# Patient Record
Sex: Female | Born: 1945 | Hispanic: No | Marital: Married | Smoking: Never smoker
Health system: Southern US, Community
[De-identification: ages and names within clinical notes are randomized; demographics above are authoritative.]

## PROBLEM LIST (undated history)

## (undated) DIAGNOSIS — K219 Gastro-esophageal reflux disease without esophagitis: Secondary | ICD-10-CM

---

## 2010-05-11 ENCOUNTER — Encounter: Admission: RE | Admit: 2010-05-11 | Discharge: 2010-05-11 | Payer: Self-pay | Admitting: Otolaryngology

## 2010-05-19 ENCOUNTER — Ambulatory Visit
Admission: RE | Admit: 2010-05-19 | Discharge: 2010-05-19 | Payer: Self-pay | Source: Home / Self Care | Admitting: Otolaryngology

## 2011-06-14 ENCOUNTER — Other Ambulatory Visit: Payer: Self-pay | Admitting: Family Medicine

## 2011-06-14 DIAGNOSIS — Z1231 Encounter for screening mammogram for malignant neoplasm of breast: Secondary | ICD-10-CM

## 2011-06-30 ENCOUNTER — Ambulatory Visit
Admission: RE | Admit: 2011-06-30 | Discharge: 2011-06-30 | Disposition: A | Discharge status: Home / Self Care | Payer: Medicare Other | Source: Ambulatory Visit | Attending: Family Medicine | Admitting: Family Medicine

## 2011-06-30 DIAGNOSIS — Z1231 Encounter for screening mammogram for malignant neoplasm of breast: Secondary | ICD-10-CM

## 2011-11-09 ENCOUNTER — Other Ambulatory Visit: Payer: Self-pay | Admitting: Family Medicine

## 2011-11-09 DIAGNOSIS — R1013 Epigastric pain: Secondary | ICD-10-CM

## 2011-11-11 ENCOUNTER — Ambulatory Visit
Admission: RE | Admit: 2011-11-11 | Discharge: 2011-11-11 | Disposition: A | Discharge status: Home / Self Care | Payer: Medicare Other | Source: Ambulatory Visit | Attending: Family Medicine | Admitting: Family Medicine

## 2011-11-11 DIAGNOSIS — R1013 Epigastric pain: Secondary | ICD-10-CM

## 2012-08-09 ENCOUNTER — Other Ambulatory Visit: Payer: Self-pay | Admitting: Family Medicine

## 2012-08-09 DIAGNOSIS — B1910 Unspecified viral hepatitis B without hepatic coma: Secondary | ICD-10-CM

## 2012-08-17 ENCOUNTER — Other Ambulatory Visit: Payer: Self-pay | Admitting: Family Medicine

## 2012-08-17 ENCOUNTER — Ambulatory Visit
Admission: RE | Admit: 2012-08-17 | Discharge: 2012-08-17 | Disposition: A | Discharge status: Home / Self Care | Payer: Medicare Other | Source: Ambulatory Visit | Attending: Family Medicine | Admitting: Family Medicine

## 2012-08-17 DIAGNOSIS — B1910 Unspecified viral hepatitis B without hepatic coma: Secondary | ICD-10-CM

## 2013-10-18 ENCOUNTER — Other Ambulatory Visit: Payer: Self-pay | Admitting: Family Medicine

## 2013-10-18 DIAGNOSIS — Z1231 Encounter for screening mammogram for malignant neoplasm of breast: Secondary | ICD-10-CM

## 2013-11-12 ENCOUNTER — Ambulatory Visit
Admission: RE | Admit: 2013-11-12 | Discharge: 2013-11-12 | Disposition: A | Discharge status: Home / Self Care | Payer: Medicare Other | Source: Ambulatory Visit | Attending: Family Medicine | Admitting: Family Medicine

## 2013-11-12 DIAGNOSIS — Z1231 Encounter for screening mammogram for malignant neoplasm of breast: Secondary | ICD-10-CM

## 2013-12-25 ENCOUNTER — Other Ambulatory Visit (HOSPITAL_COMMUNITY): Payer: Self-pay | Admitting: Family Medicine

## 2013-12-25 DIAGNOSIS — M25462 Effusion, left knee: Secondary | ICD-10-CM

## 2013-12-26 ENCOUNTER — Ambulatory Visit (HOSPITAL_COMMUNITY)
Admission: RE | Admit: 2013-12-26 | Discharge: 2013-12-26 | Disposition: A | Discharge status: Home / Self Care | Payer: Medicare Other | Source: Ambulatory Visit | Attending: Family Medicine | Admitting: Family Medicine

## 2013-12-26 DIAGNOSIS — M25462 Effusion, left knee: Secondary | ICD-10-CM

## 2013-12-26 DIAGNOSIS — M7989 Other specified soft tissue disorders: Secondary | ICD-10-CM

## 2013-12-26 NOTE — Progress Notes (Signed)
Left lower extremity venous duplex completed.  Left:  No evidence of DVT, superficial thrombosis, or Baker's cyst.  Right:  Negative for DVT in the common femoral vein.  

## 2015-05-30 ENCOUNTER — Encounter (HOSPITAL_BASED_OUTPATIENT_CLINIC_OR_DEPARTMENT_OTHER): Payer: Self-pay | Admitting: Emergency Medicine

## 2015-05-30 ENCOUNTER — Emergency Department (HOSPITAL_BASED_OUTPATIENT_CLINIC_OR_DEPARTMENT_OTHER): Payer: Medicare Other

## 2015-05-30 ENCOUNTER — Emergency Department (HOSPITAL_BASED_OUTPATIENT_CLINIC_OR_DEPARTMENT_OTHER)
Admission: EM | Admit: 2015-05-30 | Discharge: 2015-05-30 | Disposition: A | Discharge status: Home / Self Care | Payer: Medicare Other | Attending: Emergency Medicine | Admitting: Emergency Medicine

## 2015-05-30 DIAGNOSIS — R1013 Epigastric pain: Secondary | ICD-10-CM | POA: Diagnosis present

## 2015-05-30 DIAGNOSIS — Z79899 Other long term (current) drug therapy: Secondary | ICD-10-CM | POA: Diagnosis not present

## 2015-05-30 DIAGNOSIS — K219 Gastro-esophageal reflux disease without esophagitis: Secondary | ICD-10-CM | POA: Diagnosis not present

## 2015-05-30 HISTORY — DX: Gastro-esophageal reflux disease without esophagitis: K21.9

## 2015-05-30 LAB — CBC WITH DIFFERENTIAL/PLATELET
BASOS PCT: 0 % (ref 0–1)
Basophils Absolute: 0 10*3/uL (ref 0.0–0.1)
EOS ABS: 0 10*3/uL (ref 0.0–0.7)
EOS PCT: 1 % (ref 0–5)
HCT: 41.8 % (ref 36.0–46.0)
Hemoglobin: 14.4 g/dL (ref 12.0–15.0)
Lymphocytes Relative: 26 % (ref 12–46)
Lymphs Abs: 1.4 10*3/uL (ref 0.7–4.0)
MCH: 32.4 pg (ref 26.0–34.0)
MCHC: 34.4 g/dL (ref 30.0–36.0)
MCV: 94.1 fL (ref 78.0–100.0)
MONO ABS: 0.5 10*3/uL (ref 0.1–1.0)
MONOS PCT: 10 % (ref 3–12)
Neutro Abs: 3.3 10*3/uL (ref 1.7–7.7)
Neutrophils Relative %: 63 % (ref 43–77)
Platelets: 154 10*3/uL (ref 150–400)
RBC: 4.44 MIL/uL (ref 3.87–5.11)
RDW: 11.7 % (ref 11.5–15.5)
WBC: 5.3 10*3/uL (ref 4.0–10.5)

## 2015-05-30 LAB — URINALYSIS, ROUTINE W REFLEX MICROSCOPIC
BILIRUBIN URINE: NEGATIVE
Glucose, UA: NEGATIVE mg/dL
Hgb urine dipstick: NEGATIVE
Ketones, ur: NEGATIVE mg/dL
NITRITE: NEGATIVE
PH: 8.5 — AB (ref 5.0–8.0)
Protein, ur: NEGATIVE mg/dL
SPECIFIC GRAVITY, URINE: 1.015 (ref 1.005–1.030)
Urobilinogen, UA: 0.2 mg/dL (ref 0.0–1.0)

## 2015-05-30 LAB — COMPREHENSIVE METABOLIC PANEL
ALBUMIN: 4.2 g/dL (ref 3.5–5.0)
ALK PHOS: 53 U/L (ref 38–126)
ALT: 21 U/L (ref 14–54)
ANION GAP: 9 (ref 5–15)
AST: 30 U/L (ref 15–41)
BILIRUBIN TOTAL: 1.1 mg/dL (ref 0.3–1.2)
BUN: 10 mg/dL (ref 6–20)
CALCIUM: 9.3 mg/dL (ref 8.9–10.3)
CO2: 27 mmol/L (ref 22–32)
CREATININE: 0.67 mg/dL (ref 0.44–1.00)
Chloride: 103 mmol/L (ref 101–111)
GFR calc Af Amer: >60 mL/min (ref >60)
GFR calc non Af Amer: >60 mL/min (ref >60)
GLUCOSE: 131 mg/dL — AB (ref 65–99)
Potassium: 3.7 mmol/L (ref 3.5–5.1)
Sodium: 139 mmol/L (ref 135–145)
TOTAL PROTEIN: 7 g/dL (ref 6.5–8.1)

## 2015-05-30 LAB — URINE MICROSCOPIC-ADD ON

## 2015-05-30 LAB — LIPASE, BLOOD: Lipase: 28 U/L (ref 22–51)

## 2015-05-30 MED ORDER — FENTANYL CITRATE (PF) 100 MCG/2ML IJ SOLN
100.0000 ug | Freq: Once | INTRAMUSCULAR | Status: AC
  Administered 2015-05-30: 100 ug via INTRAVENOUS
  Filled 2015-05-30: qty 2

## 2015-05-30 MED ORDER — ONDANSETRON HCL 4 MG/2ML IJ SOLN
4.0000 mg | Freq: Once | INTRAMUSCULAR | Status: AC
  Administered 2015-05-30: 4 mg via INTRAVENOUS
  Filled 2015-05-30: qty 2

## 2015-05-30 MED ORDER — PANTOPRAZOLE SODIUM 40 MG IV SOLR: 40.0000 mg | Freq: Once | INTRAVENOUS | Status: DC

## 2015-05-30 MED ORDER — SODIUM CHLORIDE 0.9 % IV SOLN
INTRAVENOUS | Status: DC
Start: 2015-05-30 — End: 2015-05-30
  Administered 2015-05-30: 05:00:00 via INTRAVENOUS

## 2015-05-30 MED ORDER — IOHEXOL 300 MG/ML  SOLN
25.0000 mL | Freq: Once | INTRAMUSCULAR | Status: AC | PRN
  Administered 2015-05-30: 25 mL via ORAL

## 2015-05-30 MED ORDER — IOHEXOL 300 MG/ML  SOLN
100.0000 mL | Freq: Once | INTRAMUSCULAR | Status: AC | PRN
  Administered 2015-05-30: 100 mL via INTRAVENOUS

## 2015-05-30 NOTE — ED Notes (Signed)
Pt speaks vietnamese, son at bedside with limited english, pt also has brought what she vomited at home for md to evaluate

## 2015-05-30 NOTE — ED Notes (Signed)
Pt reports mid epigastric abdominal pain with immediate onset with nausea and vomiting, denies sob, dizziness

## 2015-05-30 NOTE — ED Provider Notes (Signed)
CSN: [id number]     Arrival date & time 05/30/15  0401 History   First MD Initiated Contact with Patient 05/30/15 0413     Chief Complaint  Patient presents with  . Abdominal Pain     (Consider location/radiation/quality/duration/timing/severity/associated sxs/prior Treatment) HPI  This is a 69 year old female with a history of GERD. She is here with epigastric pain that began about 9 PM yesterday evening and is gradually worsened. It is now severe. It is worse with palpation or movement. She has had associated nausea and vomiting. She has not had a fever, shortness of breath or lightheadedness.   Past Medical History  Diagnosis Date  . GERD (gastroesophageal reflux disease)    History reviewed. No pertinent past surgical history. History reviewed. No pertinent family history. Social History  Substance Use Topics  . Smoking status: Never Smoker   . Smokeless tobacco: None  . Alcohol Use: No   OB History    No data available     Review of Systems  All other systems reviewed and are negative.   Allergies  Review of patient's allergies indicates no known allergies.  Home Medications   Prior to Admission medications   Medication Sig Start Date End Date Taking? Authorizing Provider  alum & mag hydroxide-simeth (MAALOX/MYLANTA) 200-200-20 MG/5ML suspension Take by mouth every 6 (six) hours as needed for indigestion or heartburn.   Yes Historical Provider, MD  esomeprazole (NEXIUM) 10 MG packet Take 10 mg by mouth daily before breakfast.   Yes Historical Provider, MD   BP 171/77 mmHg  Pulse 94  Temp(Src) 98 F (36.7 C) (Oral)  Resp 20  Wt 132 lb 9 oz (60.13 kg)  SpO2 96%   Physical Exam  General: Well-developed, well-nourished female in no acute distress; appearance consistent with age of record HENT: normocephalic; atraumatic Eyes: pupils equal, round and reactive to light; extraocular muscles intact; arcus senilis bilaterally Neck: supple Heart: regular rate and  rhythm Lungs: clear to auscultation bilaterally Abdomen: soft; nondistended; pronounced epigastric tenderness; no masses or hepatosplenomegaly; bowel sounds present; no gallstones seen on bedside ultrasound Extremities: No deformity; full range of motion; pulses normal Neurologic: Awake, alert; motor function intact in all extremities and symmetric; no facial droop Skin: Warm and dry Psychiatric: Flat affect    ED Course  Procedures (including critical care time)   MDM  Nursing notes and vitals signs, including pulse oximetry, reviewed.  Summary of this visit's results, reviewed by myself:   EKG Interpretation  Date/Time:  Friday May 30 2015 04:23:22 EDT Ventricular Rate:  91 PR Interval:  138 QRS Duration: 82 QT Interval:  370 QTC Calculation: 455 R Axis:   52 Text Interpretation:  Normal sinus rhythm Possible Left atrial enlargement Borderline ECG Rate is faster Confirmed by Ania Levay  MD, Jonny Ruiz ([id number]) on 05/30/2015 4:31:50 AM       Labs:  Results for orders placed or performed during the hospital encounter of 05/30/15 (from the past 24 hour(s))  Urinalysis, Routine w reflex microscopic (not at [facility])     Status: Abnormal   Collection Time: 05/30/15  4:14 AM  Result Value Ref Range   Color, Urine YELLOW YELLOW   APPearance CLOUDY (A) CLEAR   Specific Gravity, Urine 1.015 1.005 - 1.030   pH 8.5 (H) 5.0 - 8.0   Glucose, UA NEGATIVE NEGATIVE mg/dL   Hgb urine dipstick NEGATIVE NEGATIVE   Bilirubin Urine NEGATIVE NEGATIVE   Ketones, ur NEGATIVE NEGATIVE mg/dL   Protein, ur NEGATIVE NEGATIVE mg/dL  Urobilinogen, UA 0.2 0.0 - 1.0 mg/dL   Nitrite NEGATIVE NEGATIVE   Leukocytes, UA LARGE (A) NEGATIVE  Urine microscopic-add on     Status: Abnormal   Collection Time: 05/30/15  4:14 AM  Result Value Ref Range   Squamous Epithelial / LPF RARE RARE   WBC, UA 11-20 <3 WBC/hpf   RBC / HPF 3-6 <3 RBC/hpf   Bacteria, UA MANY (A) RARE   Urine-Other AMORPHOUS URATES/PHOSPHATES    Comprehensive metabolic panel     Status: Abnormal   Collection Time: 05/30/15  4:40 AM  Result Value Ref Range   Sodium 139 135 - 145 mmol/L   Potassium 3.7 3.5 - 5.1 mmol/L   Chloride 103 101 - 111 mmol/L   CO2 27 22 - 32 mmol/L   Glucose, Bld 131 (H) 65 - 99 mg/dL   BUN 10 6 - 20 mg/dL   Creatinine, Ser 1.61 0.44 - 1.00 mg/dL   Calcium 9.3 8.9 - 09.6 mg/dL   Total Protein 7.0 6.5 - 8.1 g/dL   Albumin 4.2 3.5 - 5.0 g/dL   AST 30 15 - 41 U/L   ALT 21 14 - 54 U/L   Alkaline Phosphatase 53 38 - 126 U/L   Total Bilirubin 1.1 0.3 - 1.2 mg/dL   GFR calc non Af Amer >60 >60 mL/min   GFR calc Af Amer >60 >60 mL/min   Anion gap 9 5 - 15  Lipase, blood     Status: None   Collection Time: 05/30/15  4:40 AM  Result Value Ref Range   Lipase 28 22 - 51 U/L  CBC with Differential/Platelet     Status: None   Collection Time: 05/30/15  4:40 AM  Result Value Ref Range   WBC 5.3 4.0 - 10.5 K/uL   RBC 4.44 3.87 - 5.11 MIL/uL   Hemoglobin 14.4 12.0 - 15.0 g/dL   HCT 04.5 40.9 - 81.1 %   MCV 94.1 78.0 - 100.0 fL   MCH 32.4 26.0 - 34.0 pg   MCHC 34.4 30.0 - 36.0 g/dL   RDW 91.4 78.2 - 95.6 %   Platelets 154 150 - 400 K/uL   Neutrophils Relative % 63 43 - 77 %   Neutro Abs 3.3 1.7 - 7.7 K/uL   Lymphocytes Relative 26 12 - 46 %   Lymphs Abs 1.4 0.7 - 4.0 K/uL   Monocytes Relative 10 3 - 12 %   Monocytes Absolute 0.5 0.1 - 1.0 K/uL   Eosinophils Relative 1 0 - 5 %   Eosinophils Absolute 0.0 0.0 - 0.7 K/uL   Basophils Relative 0 0 - 1 %   Basophils Absolute 0.0 0.0 - 0.1 K/uL    Imaging Studies: Ct Abdomen Pelvis W Contrast  05/30/2015   CLINICAL DATA:  Epigastric abdominal pain.  Nausea and vomiting.  EXAM: CT ABDOMEN AND PELVIS WITH CONTRAST  TECHNIQUE: Multidetector CT imaging of the abdomen and pelvis was performed using the standard protocol following bolus administration of intravenous contrast.  CONTRAST:  25mL OMNIPAQUE IOHEXOL 300 MG/ML SOLN, OMNIPAQUE IOHEXOL 300 MG/ML SOLN   COMPARISON:  None.  FINDINGS: Lower chest:  The included lung bases are clear.  Liver: No focal lesion.  Hepatobiliary: Gallbladder physiologically distended. No biliary dilatation.  Pancreas: Normal.  Spleen: Normal.  Adrenal glands: No nodule.  Kidneys: Symmetric renal enhancement. No hydronephrosis. Small subcentimeter cortical cysts in the left kidney.  Stomach/Bowel: Stomach physiologically distended. There is a short-segment of dilated small bowel  in the right upper quadrant/mid abdomen with feculization of small bowel contents. The more distal bowel loops are decompressed. No pneumatosis, wall thickening or edema, or mesenteric swirling. Small volume of stool throughout the colon without colonic wall thickening. The appendix is normal.  Vascular/Lymphatic: No retroperitoneal adenopathy. Abdominal aorta is normal in caliber. Mild atherosclerotic calcification without aneurysm.  Reproductive: There is prominent periuterine and adnexal vascularity. Prominence of the left ovarian vein measures 8 mm. No adnexal mass.  Bladder: Minimally distended.  Other: No free air, free fluid, or intra-abdominal fluid collection.  Musculoskeletal: There are no acute or suspicious osseous abnormalities.  IMPRESSION: 1. Short-segment of dilated small bowel in the right upper/mid abdomen with feculization of small bowel contents. This may be related slow transit or focal ileus. No abrupt transition to suggest obstruction. 2. Mildly prominent periuterine vascularity and dilatation of the left ovarian vein, can be seen with pelvic congestion.   Electronically Signed   By: Rubye Oaks M.D.   On: 05/30/2015 06:12   6:20 AM Abdomen now soft, nontender, nondistended. Patient's pain significantly improved. She was able to drink or contrast without emesis. At this time admission to the hospital does not appear indicated. She and her family were advised of CT findings. We will discharge her home with instructions to return should  symptoms worsen over the next few hours.    Paula Libra, MD 05/30/15 [phone]

## 2015-06-01 LAB — URINE CULTURE

## 2016-09-03 ENCOUNTER — Encounter (HOSPITAL_BASED_OUTPATIENT_CLINIC_OR_DEPARTMENT_OTHER): Payer: Self-pay | Admitting: Emergency Medicine

## 2016-09-03 ENCOUNTER — Emergency Department (HOSPITAL_BASED_OUTPATIENT_CLINIC_OR_DEPARTMENT_OTHER)
Admission: EM | Admit: 2016-09-03 | Discharge: 2016-09-04 | Disposition: A | Discharge status: Home / Self Care | Payer: Medicare Other | Attending: Emergency Medicine | Admitting: Emergency Medicine

## 2016-09-03 DIAGNOSIS — R1013 Epigastric pain: Secondary | ICD-10-CM

## 2016-09-03 DIAGNOSIS — R197 Diarrhea, unspecified: Secondary | ICD-10-CM

## 2016-09-03 LAB — URINE MICROSCOPIC-ADD ON

## 2016-09-03 LAB — URINALYSIS, ROUTINE W REFLEX MICROSCOPIC
BILIRUBIN URINE: NEGATIVE
Glucose, UA: NEGATIVE mg/dL
KETONES UR: NEGATIVE mg/dL
NITRITE: NEGATIVE
PH: 6 (ref 5.0–8.0)
Protein, ur: NEGATIVE mg/dL
Specific Gravity, Urine: 1.002 — ABNORMAL LOW (ref 1.005–1.030)

## 2016-09-03 MED ORDER — GI COCKTAIL ~~LOC~~
30.0000 mL | Freq: Once | ORAL | Status: AC
  Administered 2016-09-04: 30 mL via ORAL

## 2016-09-03 NOTE — ED Triage Notes (Signed)
Patient through interpreter reports that she is having mid epigastric pain with frequent loose stools

## 2016-09-03 NOTE — ED Provider Notes (Signed)
[facility] Provider Note: Lowella DellJ. Lane Haasini Patnaude, MD, FACEP  CSN: [id number] MRN: [id number] ARRIVAL: 09/03/16 at 2015 ROOM: [facility]05/[facility]05  By signing my name below, I, Soijett Blue, attest that this documentation has been prepared under the direction and in the presence of Paula LibraJohn Maahi Lannan, MD. Electronically Signed: Soijett Blue, ED Scribe. 09/03/16. 11:35 PM.  CHIEF COMPLAINT  Abdominal Pain (epigastric )   HISTORY OF PRESENT ILLNESS  Debra Alvarez is a 70 y.o. female with a PMHx of GERD, who presents to the Emergency Department complaining of intermittent, epigastric abdominal pain onset 2-3 days ago. She describes it as a twisting sensation. She also notes increased "growling" sounds from her abdomen all day and she has diarrhea following eating food. Pt notes that her epigastric abdominal pain at its worse is 7/10 and at rest is 5/10. She has had sick contacts with family members with similar symptoms. She has had increased burping. She states that she has not tried any medications for the relief of her symptoms. She denies blood in stool, nausea, vomiting, sour taste in mouth, fever or chills.    Past Medical History:  Diagnosis Date  . GERD (gastroesophageal reflux disease)     History reviewed. No pertinent surgical history.  History reviewed. No pertinent family history.  Social History  Substance Use Topics  . Smoking status: Never Smoker  . Smokeless tobacco: Never Used  . Alcohol use No    Prior to Admission medications   Not on File    Allergies Patient has no known allergies.   REVIEW OF SYSTEMS  Negative except as noted here or in the History of Present Illness.   PHYSICAL EXAMINATION  Initial Vital Signs Blood pressure 168/87, pulse 76, temperature 98.5 F (36.9 C), temperature source Oral, resp. rate 18, height 5' (1.524 m), weight 130 lb (59 kg), SpO2 97 %.  Examination General: Well-developed, well-nourished female in no acute distress; appearance  consistent with age of record HENT: normocephalic; atraumatic Eyes: arcus senilis bilaterally. pupils equal, round and reactive to light; extraocular muscles intact Neck: supple Heart: regular rate and rhythm Lungs: clear to auscultation bilaterally Abdomen: soft; nondistended; epigastric tenderness; no masses or hepatosplenomegaly; bowel sounds present Extremities: No deformity; full range of motion; pulses normal Neurologic: Awake, alert and oriented; motor function intact in all extremities and symmetric; no facial droop Skin: Warm and dry Psychiatric: Flat affect   RESULTS  Summary of this visit's results, reviewed by myself:   EKG Interpretation  Date/Time:    Ventricular Rate:    PR Interval:    QRS Duration:   QT Interval:    QTC Calculation:   R Axis:     Text Interpretation:        Laboratory Studies: Results for orders placed or performed during the hospital encounter of 09/03/16 (from the past 24 hour(s))  Urinalysis, Routine w reflex microscopic (not [facility])     Status: Abnormal   Collection Time: 09/03/16  8:32 PM  Result Value Ref Range   Color, Urine YELLOW YELLOW   APPearance CLEAR CLEAR   Specific Gravity, Urine 1.002 (L) 1.005 - 1.030   pH 6.0 5.0 - 8.0   Glucose, UA NEGATIVE NEGATIVE mg/dL   Hgb urine dipstick SMALL (A) NEGATIVE   Bilirubin Urine NEGATIVE NEGATIVE   Ketones, ur NEGATIVE NEGATIVE mg/dL   Protein, ur NEGATIVE NEGATIVE mg/dL   Nitrite NEGATIVE NEGATIVE   Leukocytes, UA TRACE (A) NEGATIVE  Urine microscopic-add on     Status:  Abnormal   Collection Time: 09/03/16  8:32 PM  Result Value Ref Range   Squamous Epithelial / LPF 0-5 (A) NONE SEEN   WBC, UA 0-5 0 - 5 WBC/hpf   RBC / HPF 0-5 0 - 5 RBC/hpf   Bacteria, UA RARE (A) NONE SEEN   Urine-Other MUCOUS PRESENT   CBC with Differential/Platelet     Status: Abnormal   Collection Time: 09/03/16 11:37 PM  Result Value Ref Range   WBC 3.1 (L) 4.0 - 10.5 K/uL   RBC 4.48 3.87 - 5.11  MIL/uL   Hemoglobin 14.5 12.0 - 15.0 g/dL   HCT 78.242.7 95.636.0 - 21.346.0 %   MCV 95.3 78.0 - 100.0 fL   MCH 32.4 26.0 - 34.0 pg   MCHC 34.0 30.0 - 36.0 g/dL   RDW 08.611.8 57.811.5 - 46.915.5 %   Platelets 122 (L) 150 - 400 K/uL   Neutrophils Relative % 39 %   Neutro Abs 1.2 (L) 1.7 - 7.7 K/uL   Lymphocytes Relative 34 %   Lymphs Abs 1.1 0.7 - 4.0 K/uL   Monocytes Relative 27 %   Monocytes Absolute 0.8 0.1 - 1.0 K/uL   Eosinophils Relative 0 %   Eosinophils Absolute 0.0 0.0 - 0.7 K/uL   Basophils Relative 0 %   Basophils Absolute 0.0 0.0 - 0.1 K/uL  Comprehensive metabolic panel     Status: None   Collection Time: 09/03/16 11:37 PM  Result Value Ref Range   Sodium 139 135 - 145 mmol/L   Potassium 3.5 3.5 - 5.1 mmol/L   Chloride 107 101 - 111 mmol/L   CO2 25 22 - 32 mmol/L   Glucose, Bld 98 65 - 99 mg/dL   BUN 7 6 - 20 mg/dL   Creatinine, Ser 6.290.49 0.44 - 1.00 mg/dL   Calcium 9.1 8.9 - 52.810.3 mg/dL   Total Protein 7.6 6.5 - 8.1 g/dL   Albumin 4.5 3.5 - 5.0 g/dL   AST 25 15 - 41 U/L   ALT 25 14 - 54 U/L   Alkaline Phosphatase 61 38 - 126 U/L   Total Bilirubin 0.8 0.3 - 1.2 mg/dL   GFR calc non Af Amer >60 >60 mL/min   GFR calc Af Amer >60 >60 mL/min   Anion gap 7 5 - 15  Lipase, blood     Status: None   Collection Time: 09/03/16 11:37 PM  Result Value Ref Range   Lipase 23 11 - 51 U/L   Imaging Studies: No results found.  ED COURSE  Nursing notes and initial vitals signs, including pulse oximetry, reviewed.  Vitals:   09/03/16 2034 09/03/16 2035 09/03/16 2250  BP:  163/82 168/87  Pulse:  81 76  Resp:  18 18  Temp:  98.5 F (36.9 C)   TempSrc:  Oral   SpO2:  95% 97%  Weight: 130 lb (59 kg)    Height: 5' (1.524 m)     2:07 AM Patient feeling better after GI cocktail. Laboratory studies reassuring. She may have a viral enteritis but with her history of GERD we will place her on a PPI for 2 weeks. She was advised to take over-the-counter Imodium for diarrhea.  PROCEDURES    ED  DIAGNOSES     ICD-9-CM ICD-10-CM   1. Epigastric abdominal pain 789.06 R10.13   2. Diarrhea in adult patient 787.91 R19.7     I personally performed the services described in this documentation, which was scribed in my presence. The  recorded information has been reviewed and is accurate.     Paula Libra, MD 09/04/16 [phone]

## 2016-09-04 DIAGNOSIS — R197 Diarrhea, unspecified: Secondary | ICD-10-CM | POA: Diagnosis not present

## 2016-09-04 LAB — CBC WITH DIFFERENTIAL/PLATELET
BASOS ABS: 0 10*3/uL (ref 0.0–0.1)
Basophils Relative: 0 %
EOS PCT: 0 %
Eosinophils Absolute: 0 10*3/uL (ref 0.0–0.7)
HEMATOCRIT: 42.7 % (ref 36.0–46.0)
Hemoglobin: 14.5 g/dL (ref 12.0–15.0)
LYMPHS ABS: 1.1 10*3/uL (ref 0.7–4.0)
LYMPHS PCT: 34 %
MCH: 32.4 pg (ref 26.0–34.0)
MCHC: 34 g/dL (ref 30.0–36.0)
MCV: 95.3 fL (ref 78.0–100.0)
Monocytes Absolute: 0.8 10*3/uL (ref 0.1–1.0)
Monocytes Relative: 27 %
NEUTROS ABS: 1.2 10*3/uL — AB (ref 1.7–7.7)
Neutrophils Relative %: 39 %
PLATELETS: 122 10*3/uL — AB (ref 150–400)
RBC: 4.48 MIL/uL (ref 3.87–5.11)
RDW: 11.8 % (ref 11.5–15.5)
WBC: 3.1 10*3/uL — AB (ref 4.0–10.5)

## 2016-09-04 LAB — COMPREHENSIVE METABOLIC PANEL
ALK PHOS: 61 U/L (ref 38–126)
ALT: 25 U/L (ref 14–54)
AST: 25 U/L (ref 15–41)
Albumin: 4.5 g/dL (ref 3.5–5.0)
Anion gap: 7 (ref 5–15)
BILIRUBIN TOTAL: 0.8 mg/dL (ref 0.3–1.2)
BUN: 7 mg/dL (ref 6–20)
CALCIUM: 9.1 mg/dL (ref 8.9–10.3)
CHLORIDE: 107 mmol/L (ref 101–111)
CO2: 25 mmol/L (ref 22–32)
CREATININE: 0.49 mg/dL (ref 0.44–1.00)
Glucose, Bld: 98 mg/dL (ref 65–99)
Potassium: 3.5 mmol/L (ref 3.5–5.1)
Sodium: 139 mmol/L (ref 135–145)
TOTAL PROTEIN: 7.6 g/dL (ref 6.5–8.1)

## 2016-09-04 LAB — LIPASE, BLOOD: LIPASE: 23 U/L (ref 11–51)

## 2016-09-04 MED ORDER — LOPERAMIDE HCL 2 MG PO CAPS: 4.0000 mg | ORAL_CAPSULE | Freq: Once | ORAL | Status: DC

## 2016-09-04 MED ORDER — GI COCKTAIL ~~LOC~~
ORAL | Status: AC
  Filled 2016-09-04: qty 30

## 2016-09-04 MED ORDER — OMEPRAZOLE 20 MG PO CPDR: 20.0000 mg | DELAYED_RELEASE_CAPSULE | Freq: Every day | ORAL | 0 refills | Status: AC

## 2016-09-04 MED ORDER — PANTOPRAZOLE SODIUM 40 MG PO TBEC: 40.0000 mg | DELAYED_RELEASE_TABLET | Freq: Once | ORAL | Status: DC

## 2017-03-28 IMAGING — CT CT ABD-PELV W/ CM
1 of 3 series · 14 of 32 positions shown, 19 images · IV contrast (omnipaque)
Comparison: None.

CLINICAL DATA: Epigastric abdominal pain.  Nausea and vomiting.

EXAM:
CT ABDOMEN AND PELVIS WITH CONTRAST
TECHNIQUE: Multidetector CT imaging of the abdomen and pelvis was performed
using the standard protocol following bolus administration of
intravenous contrast.
CONTRAST:  25mL OMNIPAQUE IOHEXOL 300 MG/ML SOLN, 100mL OMNIPAQUE
IOHEXOL 300 MG/ML SOLN

[Series 2: abd/pelvis 5.0 b31f · axial · 0.70mm/px · z∈[+913,+1293]mm · 14 of 86 slices shown, 19 images]
[im 5/86  soft-tissue]
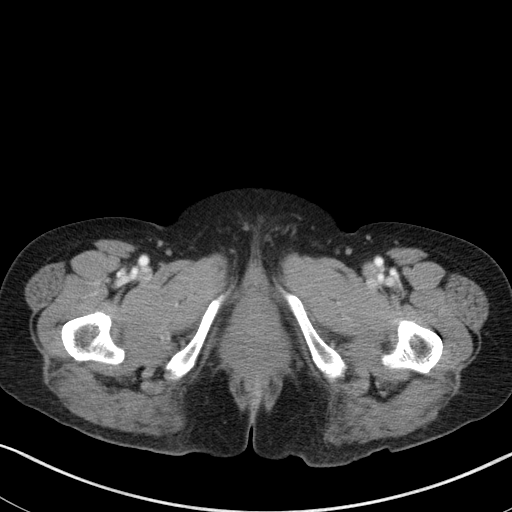
[im 5/86  bone]
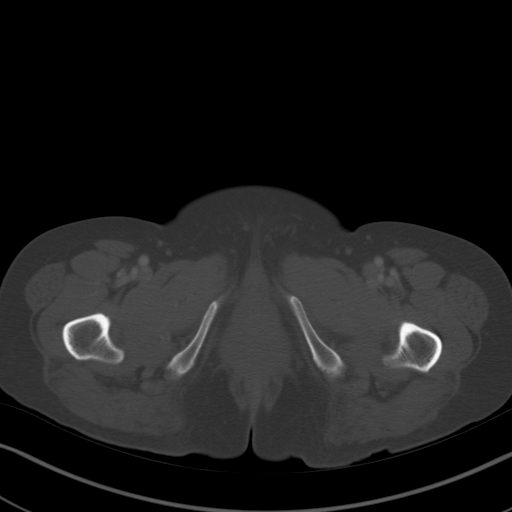
[im 14/86  soft-tissue]
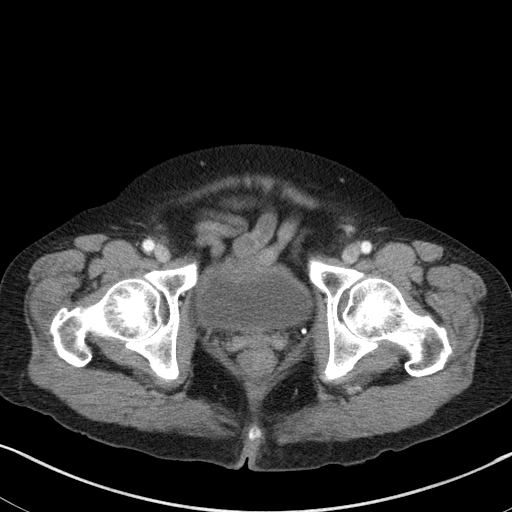
[im 18/86  soft-tissue]
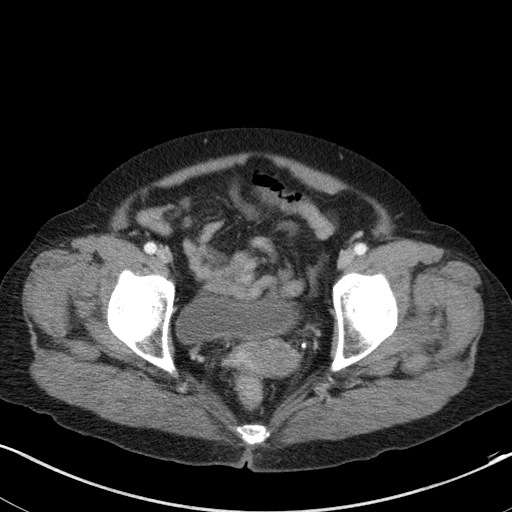
[im 23/86  soft-tissue]
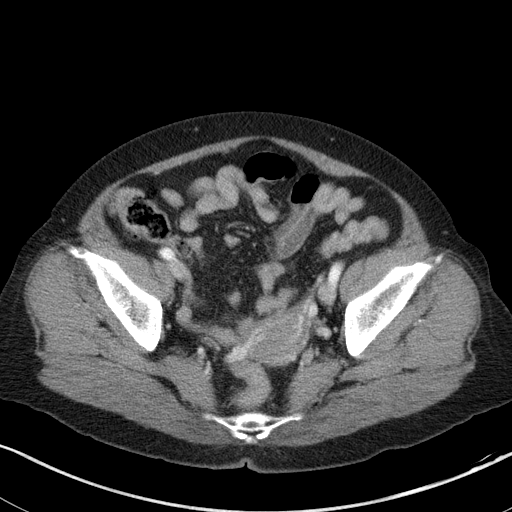
[im 32/86  soft-tissue]
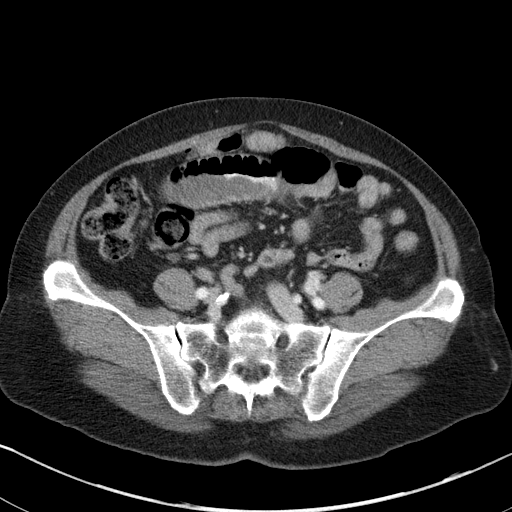
[im 36/86  soft-tissue]
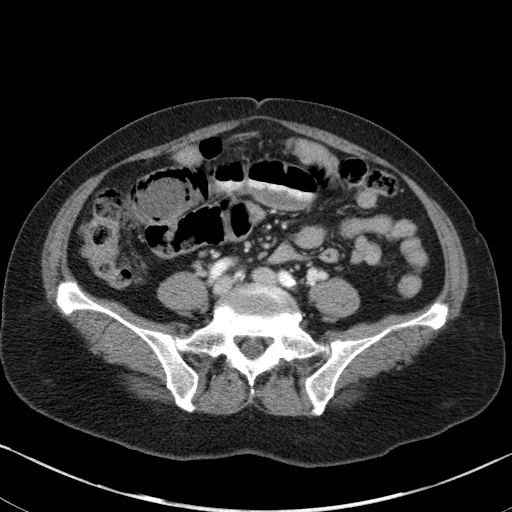
[im 45/86  soft-tissue]
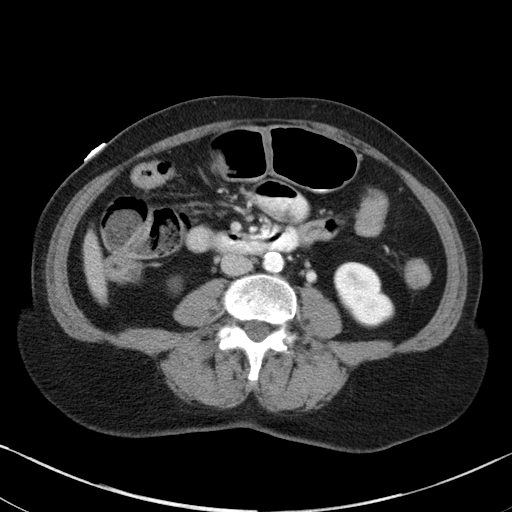
[im 50/86  soft-tissue]
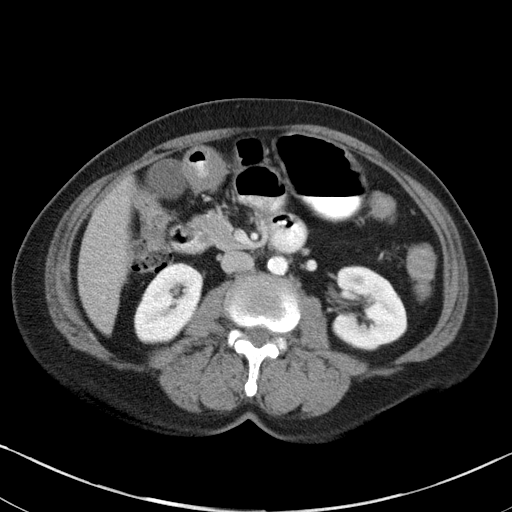
[im 54/86  soft-tissue]
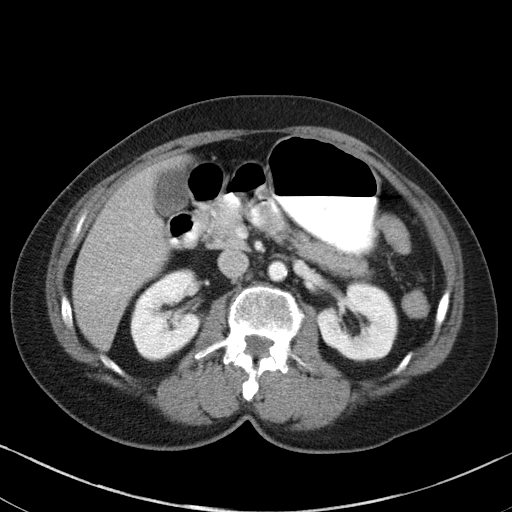
[im 54/86  bone]
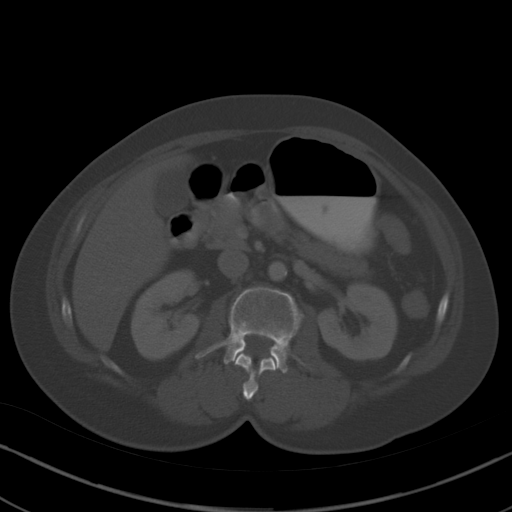
[im 63/86  soft-tissue]
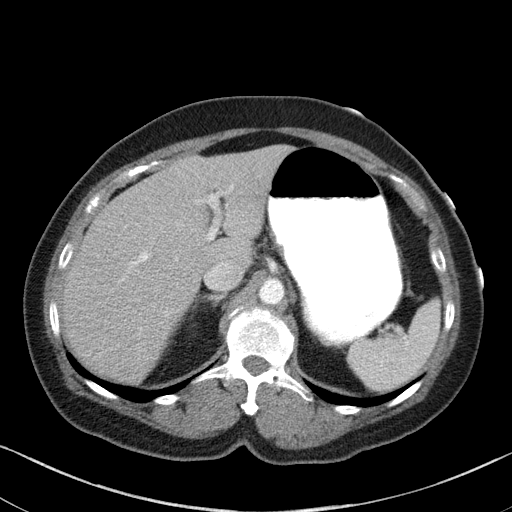
[im 68/86  soft-tissue]
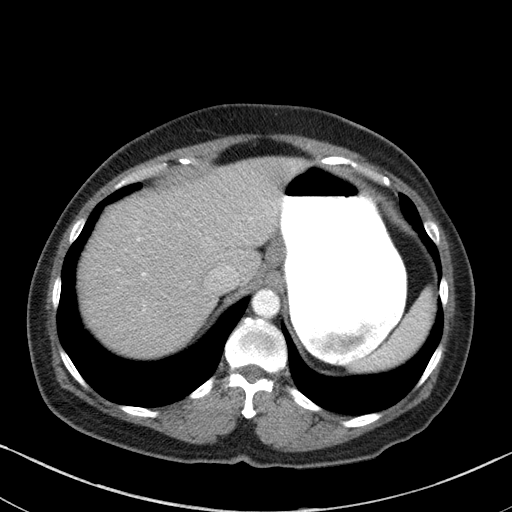
[im 68/86  lung]
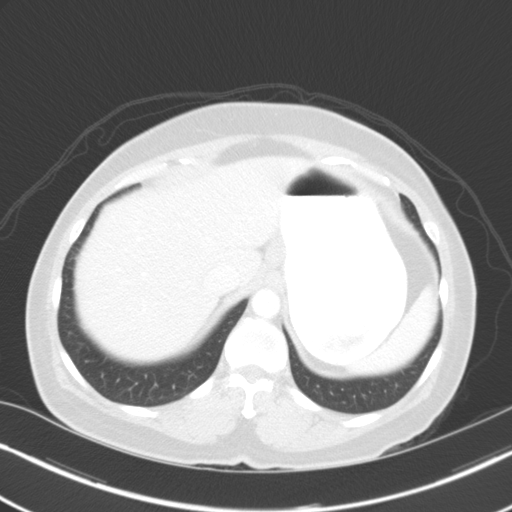
[im 72/86  soft-tissue]
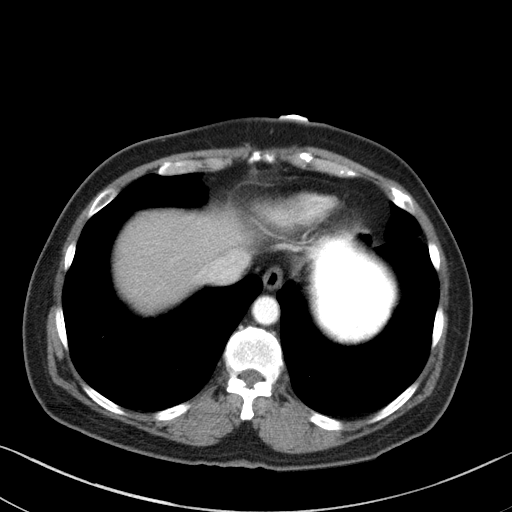
[im 72/86  lung]
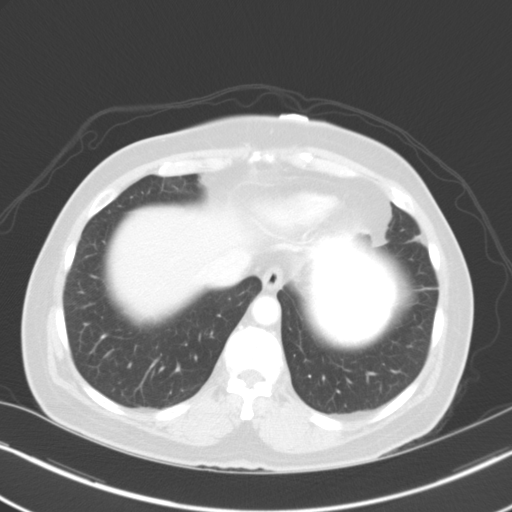
[im 77/86  lung]
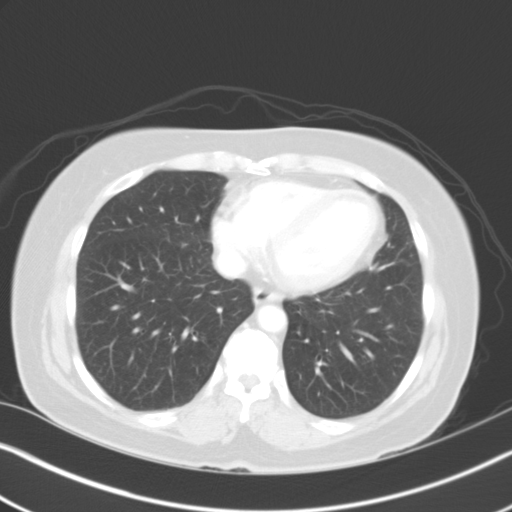
[im 81/86  soft-tissue]
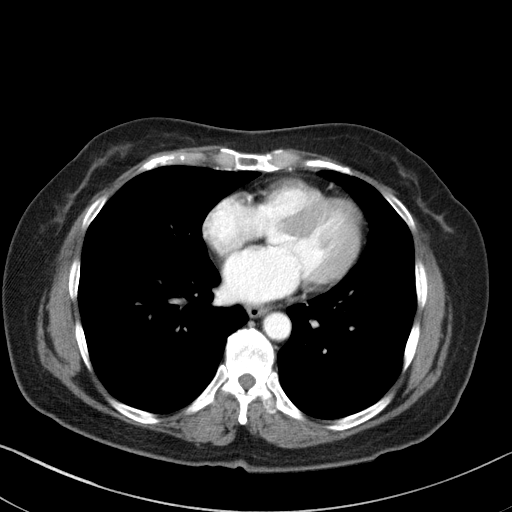
[im 81/86  lung]
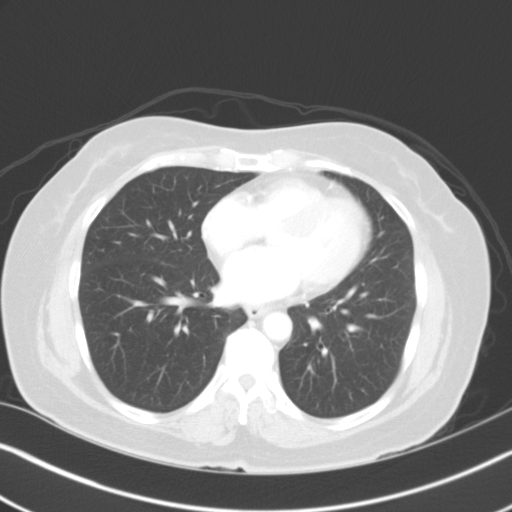

[14 of 32 positions shown; findings below may reference images not displayed]

FINDINGS: Lower chest:  The included lung bases are clear.

Liver: No focal lesion.

Hepatobiliary: Gallbladder physiologically distended. No biliary
dilatation.

Pancreas: Normal.

Spleen: Normal.

Adrenal glands: No nodule.

Kidneys: Symmetric renal enhancement. No hydronephrosis. Small
subcentimeter cortical cysts in the left kidney.

Stomach/Bowel: Stomach physiologically distended. There is a
short-segment of dilated small bowel in the right upper quadrant/mid
abdomen with feculization of small bowel contents. The more distal
bowel loops are decompressed. No pneumatosis, wall thickening or
edema, or mesenteric swirling. Small volume of stool throughout the
colon without colonic wall thickening. The appendix is normal.

Vascular/Lymphatic: No retroperitoneal adenopathy. Abdominal aorta
is normal in caliber. Mild atherosclerotic calcification without
aneurysm.

Reproductive: There is prominent periuterine and adnexal
vascularity. Prominence of the left ovarian vein measures 8 mm. No
adnexal mass.

Bladder: Minimally distended.

Other: No free air, free fluid, or intra-abdominal fluid collection.

Musculoskeletal: There are no acute or suspicious osseous
abnormalities.
IMPRESSION: 1. Short-segment of dilated small bowel in the right upper/mid
abdomen with feculization of small bowel contents. This may be
related slow transit or focal ileus. No abrupt transition to suggest
obstruction.
2. Mildly prominent periuterine vascularity and dilatation of the
left ovarian vein, can be seen with pelvic congestion.

## 2023-03-15 ENCOUNTER — Encounter (HOSPITAL_BASED_OUTPATIENT_CLINIC_OR_DEPARTMENT_OTHER): Payer: Self-pay | Admitting: Emergency Medicine

## 2023-03-15 ENCOUNTER — Other Ambulatory Visit: Payer: Self-pay

## 2023-03-15 DIAGNOSIS — R112 Nausea with vomiting, unspecified: Secondary | ICD-10-CM | POA: Diagnosis not present

## 2023-03-15 DIAGNOSIS — R1013 Epigastric pain: Secondary | ICD-10-CM | POA: Insufficient documentation

## 2023-03-15 NOTE — ED Triage Notes (Signed)
Patient arrived via POV c/o abdominal pain with emesis x 3hr pta. Patient family states 7 episodes of emesis. Patient family states epigastric pain. Patient is AO x 4, VS WDL, unable to walk at this time.

## 2023-03-16 ENCOUNTER — Emergency Department (HOSPITAL_BASED_OUTPATIENT_CLINIC_OR_DEPARTMENT_OTHER)
Admission: EM | Admit: 2023-03-16 | Discharge: 2023-03-16 | Disposition: A | Discharge status: Home / Self Care | Payer: 59 | Attending: Emergency Medicine | Admitting: Emergency Medicine

## 2023-03-16 DIAGNOSIS — R112 Nausea with vomiting, unspecified: Secondary | ICD-10-CM

## 2023-03-16 LAB — COMPREHENSIVE METABOLIC PANEL
ALT: 29 U/L (ref 0–44)
AST: 29 U/L (ref 15–41)
Albumin: 4.7 g/dL (ref 3.5–5.0)
Alkaline Phosphatase: 55 U/L (ref 38–126)
Anion gap: 12 (ref 5–15)
BUN: 12 mg/dL (ref 8–23)
CO2: 24 mmol/L (ref 22–32)
Calcium: 9.7 mg/dL (ref 8.9–10.3)
Chloride: 103 mmol/L (ref 98–111)
Creatinine, Ser: 0.73 mg/dL (ref 0.44–1.00)
GFR, Estimated: >60 mL/min (ref >60)
Glucose, Bld: 142 mg/dL — ABNORMAL HIGH (ref 70–99)
Potassium: 3.4 mmol/L — ABNORMAL LOW (ref 3.5–5.1)
Sodium: 139 mmol/L (ref 135–145)
Total Bilirubin: 1 mg/dL (ref 0.3–1.2)
Total Protein: 8.4 g/dL — ABNORMAL HIGH (ref 6.5–8.1)

## 2023-03-16 LAB — URINALYSIS, MICROSCOPIC (REFLEX)

## 2023-03-16 LAB — LIPASE, BLOOD: Lipase: 38 U/L (ref 11–51)

## 2023-03-16 LAB — URINALYSIS, ROUTINE W REFLEX MICROSCOPIC
Bilirubin Urine: NEGATIVE
Glucose, UA: NEGATIVE mg/dL
Ketones, ur: NEGATIVE mg/dL
Nitrite: NEGATIVE
Protein, ur: 100 mg/dL — AB
Specific Gravity, Urine: 1.025 (ref 1.005–1.030)
pH: 7 (ref 5.0–8.0)

## 2023-03-16 LAB — CBC
HCT: 47.4 % — ABNORMAL HIGH (ref 36.0–46.0)
Hemoglobin: 16.1 g/dL — ABNORMAL HIGH (ref 12.0–15.0)
MCH: 31.3 pg (ref 26.0–34.0)
MCHC: 34 g/dL (ref 30.0–36.0)
MCV: 92 fL (ref 80.0–100.0)
Platelets: 143 10*3/uL — ABNORMAL LOW (ref 150–400)
RBC: 5.15 MIL/uL — ABNORMAL HIGH (ref 3.87–5.11)
RDW: 12.6 % (ref 11.5–15.5)
WBC: 10.5 10*3/uL (ref 4.0–10.5)
nRBC: 0 % (ref 0.0–0.2)

## 2023-03-16 MED ORDER — ONDANSETRON HCL 4 MG/2ML IJ SOLN
4.0000 mg | Freq: Once | INTRAMUSCULAR | Status: AC
  Administered 2023-03-16: 4 mg via INTRAVENOUS
  Filled 2023-03-16: qty 2

## 2023-03-16 MED ORDER — ONDANSETRON 8 MG PO TBDP: 8.0000 mg | ORAL_TABLET | Freq: Three times a day (TID) | ORAL | 0 refills | Status: AC | PRN

## 2023-03-16 MED ORDER — SODIUM CHLORIDE 0.9 % IV BOLUS
1000.0000 mL | Freq: Once | INTRAVENOUS | Status: AC
  Administered 2023-03-16: 1000 mL via INTRAVENOUS

## 2023-03-16 NOTE — ED Provider Notes (Signed)
[facility] Provider Note: Debra Dell, MD, FACEP  CSN: [id number] MRN: [id number] ARRIVAL: 03/15/23 at 2349 ROOM: [facility]06/[facility]06   CHIEF COMPLAINT  Vomiting   HISTORY OF PRESENT ILLNESS  03/16/23 1:59 AM Debra Alvarez is a 77 y.o. female with epigastric abdominal pain (7 out of 10) with nausea and vomited that started about 3 hours prior to arrival.  He had approximately 7 episodes of vomiting.  She describes the emesis as bilious (yellow).  She took 20 mg of Prilosec prior to arrival and her epigastric pain has significantly improved.  She denies diarrhea.   Past Medical History:  Diagnosis Date   GERD (gastroesophageal reflux disease)     History reviewed. No pertinent surgical history.  History reviewed. No pertinent family history.  Social History   Tobacco Use   Smoking status: Never   Smokeless tobacco: Never  Vaping Use   Vaping Use: Never used  Substance Use Topics   Alcohol use: No    Prior to Admission medications   Medication Sig Start Date End Date Taking? Authorizing Provider  ondansetron (ZOFRAN-ODT) 8 MG disintegrating tablet Take 1 tablet (8 mg total) by mouth every 8 (eight) hours as needed for nausea or vomiting. 03/16/23  Yes Kaven Cumbie, MD  omeprazole (PRILOSEC) 20 MG capsule Take 1 capsule (20 mg total) by mouth daily. 09/04/16   Kennisha Qin, Jonny Ruiz, MD    Allergies Patient has no known allergies.   REVIEW OF SYSTEMS  Negative except as noted here or in the History of Present Illness.   PHYSICAL EXAMINATION  Initial Vital Signs Blood pressure (!) 143/78, pulse 100, temperature 97.8 F (36.6 C), resp. rate 19, height 5\' 2"  (1.575 m), weight 62.6 kg, SpO2 96 %.  Examination General: Well-developed, well-nourished female in no acute distress; appearance consistent with age of record HENT: normocephalic; atraumatic Eyes: Normal appearance Neck: supple Heart: regular rate and rhythm Lungs: clear to auscultation bilaterally Abdomen: soft;  nondistended; nontender; bowel sounds present Extremities: No deformity; full range of motion; pulses normal Neurologic: Awake, alert and oriented; motor function intact in all extremities and symmetric; no facial droop Skin: Warm and dry Psychiatric: Flat affect   RESULTS  Summary of this visit's results, reviewed and interpreted by myself:   EKG Interpretation  Date/Time:  Wednesday March 16 2023 00:00:54 EDT Ventricular Rate:  106 PR Interval:  130 QRS Duration: 89 QT Interval:  350 QTC Calculation: 465 R Axis:   80 Text Interpretation: Sinus tachycardia Biatrial enlargement Borderline ST depression, diffuse leads Baseline wander in lead(s) V1 Rate is faster Confirmed by Paula Libra ([id number]) on 03/16/2023 12:04:56 AM       Laboratory Studies: Results for orders placed or performed during the hospital encounter of 03/16/23 (from the past 24 hour(s))  Lipase, blood     Status: None   Collection Time: 03/16/23 12:28 AM  Result Value Ref Range   Lipase 38 11 - 51 U/L  Comprehensive metabolic panel     Status: Abnormal   Collection Time: 03/16/23 12:28 AM  Result Value Ref Range   Sodium 139 135 - 145 mmol/L   Potassium 3.4 (L) 3.5 - 5.1 mmol/L   Chloride 103 98 - 111 mmol/L   CO2 24 22 - 32 mmol/L   Glucose, Bld 142 (H) 70 - 99 mg/dL   BUN 12 8 - 23 mg/dL   Creatinine, Ser 6.21 0.44 - 1.00 mg/dL   Calcium 9.7 8.9 - 30.8 mg/dL   Total Protein 8.4 (  H) 6.5 - 8.1 g/dL   Albumin 4.7 3.5 - 5.0 g/dL   AST 29 15 - 41 U/L   ALT 29 0 - 44 U/L   Alkaline Phosphatase 55 38 - 126 U/L   Total Bilirubin 1.0 0.3 - 1.2 mg/dL   GFR, Estimated >45 >40 mL/min   Anion gap 12 5 - 15  CBC     Status: Abnormal   Collection Time: 03/16/23 12:28 AM  Result Value Ref Range   WBC 10.5 4.0 - 10.5 K/uL   RBC 5.15 (H) 3.87 - 5.11 MIL/uL   Hemoglobin 16.1 (H) 12.0 - 15.0 g/dL   HCT 98.1 (H) 19.1 - 47.8 %   MCV 92.0 80.0 - 100.0 fL   MCH 31.3 26.0 - 34.0 pg   MCHC 34.0 30.0 - 36.0 g/dL   RDW  29.5 62.1 - 30.8 %   Platelets 143 (L) 150 - 400 K/uL   nRBC 0.0 0.0 - 0.2 %  Urinalysis, Routine w reflex microscopic -Urine, Clean Catch     Status: Abnormal   Collection Time: 03/16/23 12:28 AM  Result Value Ref Range   Color, Urine YELLOW YELLOW   APPearance CLEAR CLEAR   Specific Gravity, Urine 1.025 1.005 - 1.030   pH 7.0 5.0 - 8.0   Glucose, UA NEGATIVE NEGATIVE mg/dL   Hgb urine dipstick TRACE (A) NEGATIVE   Bilirubin Urine NEGATIVE NEGATIVE   Ketones, ur NEGATIVE NEGATIVE mg/dL   Protein, ur 657 (A) NEGATIVE mg/dL   Nitrite NEGATIVE NEGATIVE   Leukocytes,Ua MODERATE (A) NEGATIVE  Urinalysis, Microscopic (reflex)     Status: Abnormal   Collection Time: 03/16/23 12:28 AM  Result Value Ref Range   RBC / HPF 0-5 0 - 5 RBC/hpf   WBC, UA 6-10 0 - 5 WBC/hpf   Bacteria, UA FEW (A) NONE SEEN   Squamous Epithelial / HPF 0-5 0 - 5 /HPF   WBC Clumps PRESENT    Mucus PRESENT    Imaging Studies: No results found.  ED COURSE and MDM  Nursing notes, initial and subsequent vitals signs, including pulse oximetry, reviewed and interpreted by myself.  Vitals:   03/16/23 0215 03/16/23 0230 03/16/23 0245 03/16/23 0345  BP:  138/79 (!) 143/80 139/76  Pulse: 85 79 85 89  Resp: (!) 21 15 17 17   Temp:      SpO2: 95% 95% 94% 95%  Weight:      Height:       Medications  ondansetron (ZOFRAN) injection 4 mg (4 mg Intravenous Given 03/16/23 0223)  sodium chloride 0.9 % bolus 1,000 mL (0 mLs Intravenous Stopped 03/16/23 0337)   4:00 AM Patient's nausea and vomiting relieved with IV fluids and Zofran.  She is drinking fluids without vomiting.  I suspect this likely represents a viral gastrointestinal illness but could have been foodborne.  She should return if symptoms worsen.   PROCEDURES  Procedures   ED DIAGNOSES     ICD-10-CM   1. Nausea and vomiting in adult  R11.2          Janesha Brissette, MD 03/16/23 0401
